# Patient Record
Sex: Male | Born: 1980 | Race: Black or African American | Hispanic: No | Marital: Single
Health system: Southern US, Community
[De-identification: ages and names within clinical notes are randomized; demographics above are authoritative.]

---

## 2005-07-06 ENCOUNTER — Emergency Department (HOSPITAL_COMMUNITY): Admission: EM | Admit: 2005-07-06 | Discharge: 2005-07-06 | Payer: Self-pay | Admitting: Family Medicine

## 2005-09-21 ENCOUNTER — Encounter: Admission: RE | Admit: 2005-09-21 | Discharge: 2005-09-21 | Payer: Self-pay | Admitting: General Practice

## 2008-09-12 ENCOUNTER — Encounter: Admission: RE | Admit: 2008-09-12 | Discharge: 2008-09-12 | Payer: Self-pay | Admitting: General Practice

## 2010-06-14 IMAGING — CR DG CHEST 2V
2 series · 2 of 2 positions shown · non-contrast
Comparison: 09/21/2005

CLINICAL DATA: History of PPD with BCG

CHEST - 2 VIEW

[view not recorded (1 of 2)]
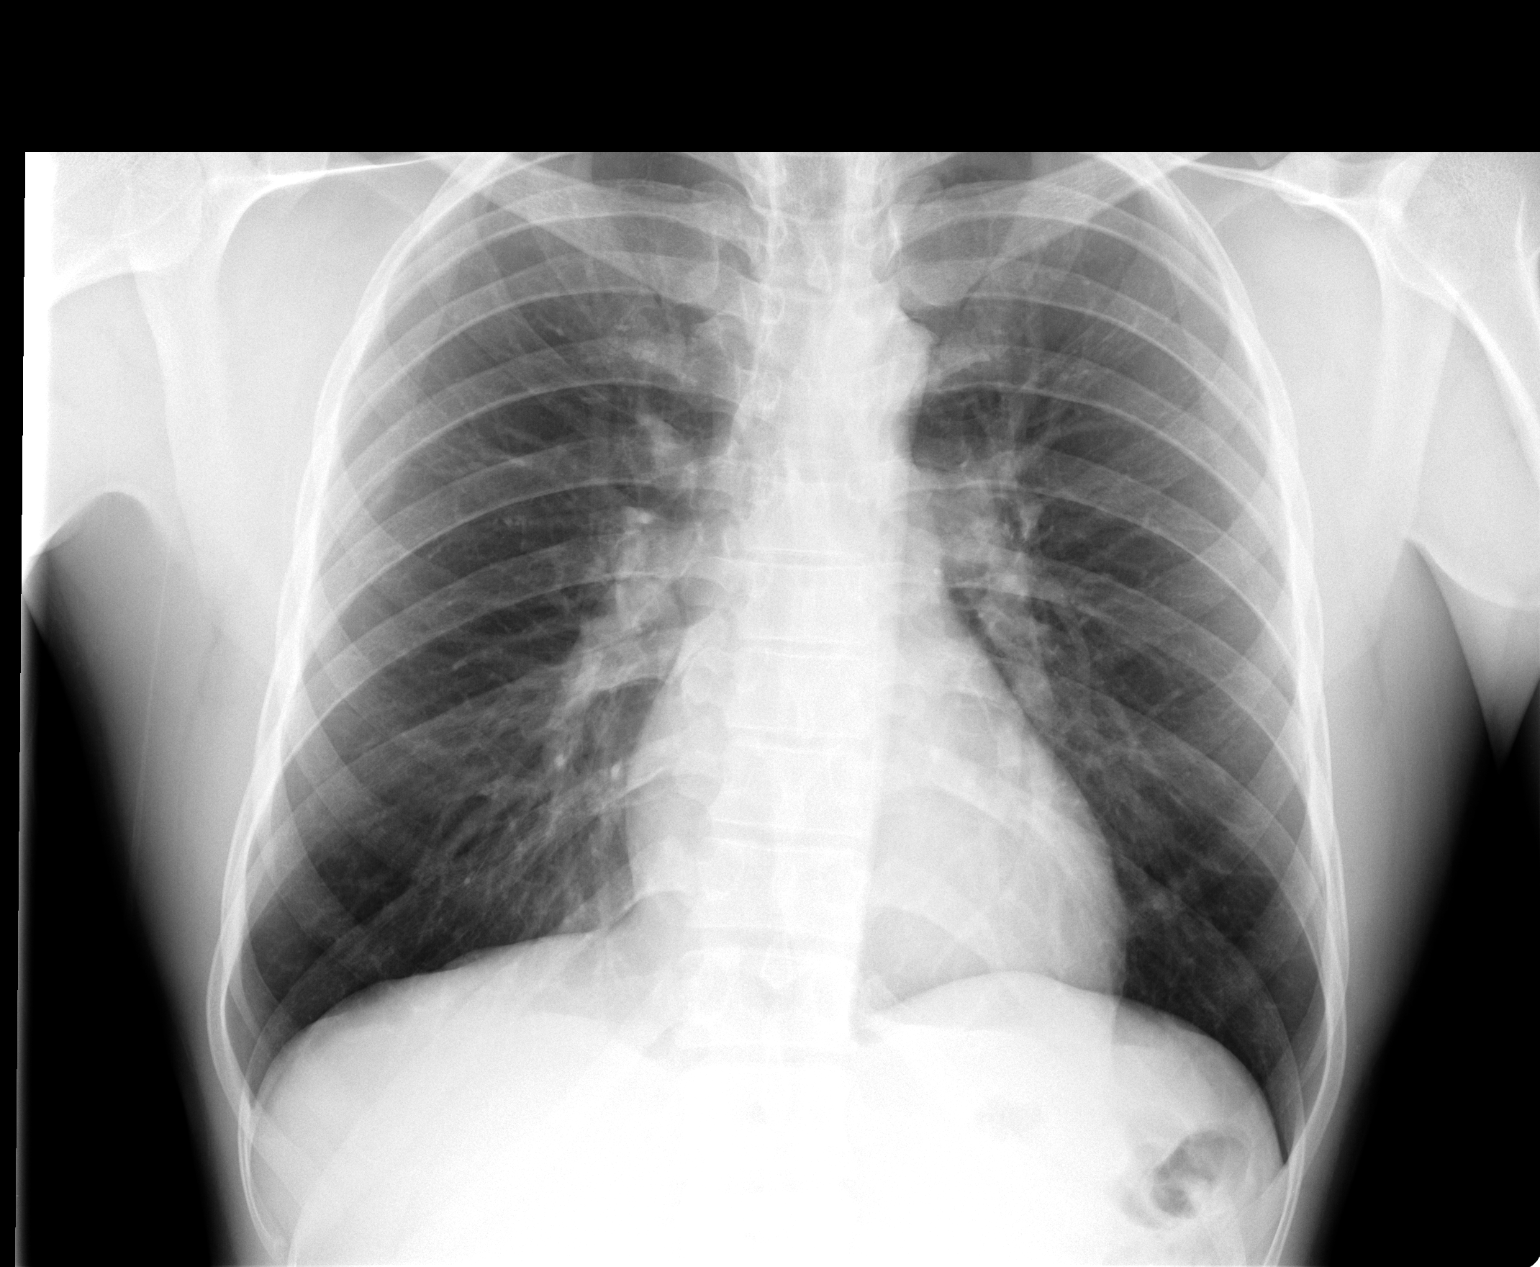

[view not recorded (2 of 2)]
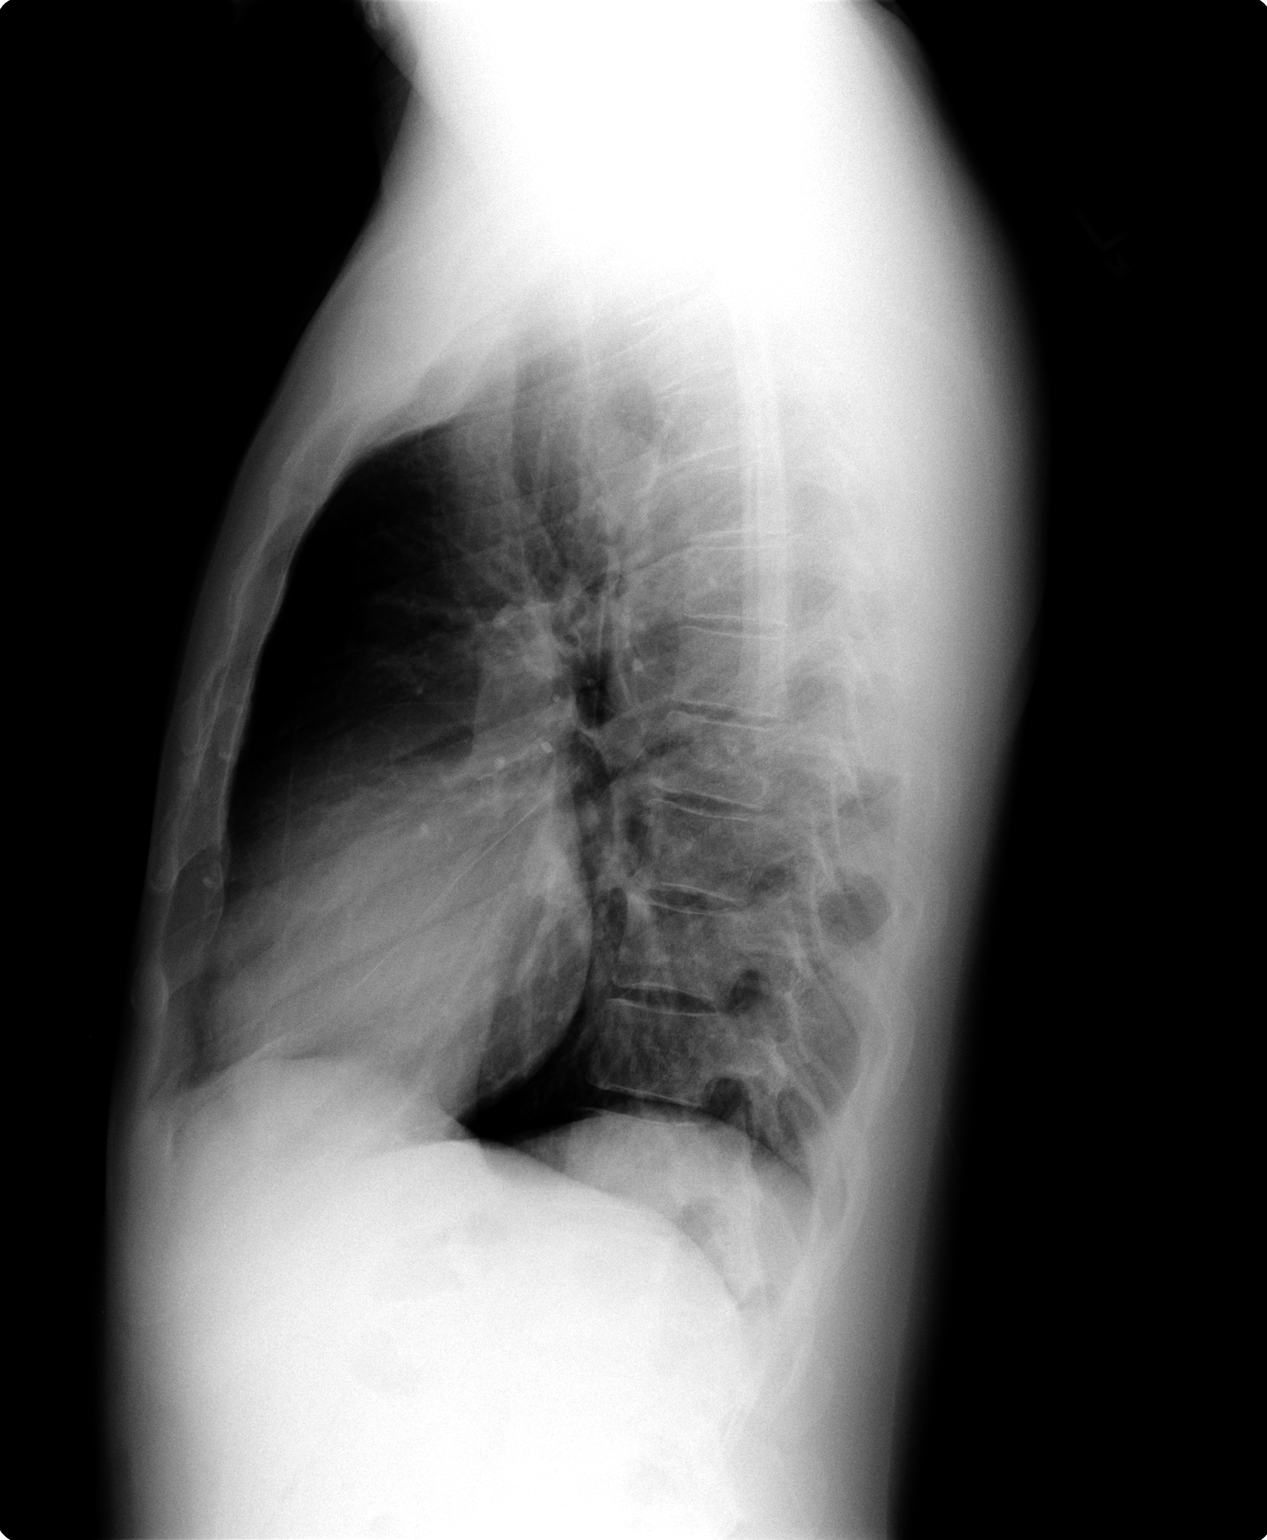

[2 of 2 positions shown; findings below may reference images not displayed]

FINDINGS: The heart size and mediastinal contours are within normal
limits.  Both lungs are clear.  The visualized skeletal structures
are unremarkable.
IMPRESSION: No active cardiopulmonary disease.

## 2010-06-22 ENCOUNTER — Emergency Department (HOSPITAL_COMMUNITY): Payer: No Typology Code available for payment source

## 2010-06-22 ENCOUNTER — Emergency Department (HOSPITAL_COMMUNITY)
Admission: EM | Admit: 2010-06-22 | Discharge: 2010-06-22 | Disposition: A | Discharge status: Home / Self Care | Payer: No Typology Code available for payment source | Attending: Emergency Medicine | Admitting: Emergency Medicine

## 2010-06-22 DIAGNOSIS — R071 Chest pain on breathing: Secondary | ICD-10-CM | POA: Insufficient documentation
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
## 2013-03-12 ENCOUNTER — Telehealth (HOSPITAL_COMMUNITY): Payer: Self-pay | Admitting: Emergency Medicine

## 2013-03-12 ENCOUNTER — Emergency Department (INDEPENDENT_AMBULATORY_CARE_PROVIDER_SITE_OTHER)
Admission: EM | Admit: 2013-03-12 | Discharge: 2013-03-12 | Disposition: A | Discharge status: Home / Self Care | Payer: Self-pay | Source: Home / Self Care | Attending: Family Medicine | Admitting: Family Medicine

## 2013-03-12 ENCOUNTER — Encounter (HOSPITAL_COMMUNITY): Payer: Self-pay | Admitting: Emergency Medicine

## 2013-03-12 DIAGNOSIS — K645 Perianal venous thrombosis: Secondary | ICD-10-CM

## 2013-03-12 MED ORDER — HYDROCORTISONE ACE-PRAMOXINE 1-1 % RE FOAM: 1.0000 | Freq: Four times a day (QID) | RECTAL | Status: AC

## 2013-03-12 MED ORDER — HYDROCORTISONE ACE-PRAMOXINE 1-1 % RE FOAM: 1.0000 | Freq: Four times a day (QID) | RECTAL | Status: DC

## 2013-03-12 MED ORDER — DOCUSATE SODIUM 100 MG PO CAPS: 100.0000 mg | ORAL_CAPSULE | Freq: Two times a day (BID) | ORAL | Status: AC

## 2013-03-12 NOTE — ED Notes (Signed)
Family member called and said the Proctofoam was $120.00 and he could not afford that.  She asked if it can be changed to Proctosol HC cream.  Discussed with Almedia Balls PA.  He said the foam would help better but if he can't pay out of pocket then it was OK to change it.  23  I called [facility] pharmacist @ [phone] and gave the changed order. Vassie Moselle 03/12/2013

## 2013-03-12 NOTE — ED Notes (Signed)
C/O HEMORRHOIDS WHICH COME AND GO FOR YEARS BUT RECENTLY HAS HAD ACUTE EPISODE IN THE LAST 24 HOURS WITCH HAZEL, WARM COMPRESS WAS USED AS TREATMENT. CREAMS HAS BEEN USED IN THE PAST.

## 2013-03-12 NOTE — ED Provider Notes (Signed)
CSN: [id number]     Arrival date & time 03/12/13  1552 History   First MD Initiated Contact with Patient 03/12/13 1657     Chief Complaint  Patient presents with  . Hemorrhoids   (Consider location/radiation/quality/duration/timing/severity/associated sxs/prior Treatment) HPI Comments: 32 year old male presents complaining of possible hemorrhoids. He has had these in the past. He has occasional bleeding and itching her once in the past they became very swollen and painful. He did get better after a few days. This time, he has again developed painful swelling around the rectum. The area is very painful to touch or when he has a bowel movement. The pain has been gradually increasing. He has used witch hazel and warm compresses which helps temporarily.   History reviewed. No pertinent past medical history. No past surgical history on file. No family history on file. History  Substance Use Topics  . Smoking status: Not on file  . Smokeless tobacco: Not on file  . Alcohol Use: Not on file    Review of Systems  Constitutional: Negative for fever, chills and fatigue.  HENT: Negative for sore throat.   Eyes: Negative for visual disturbance.  Respiratory: Negative for cough and shortness of breath.   Cardiovascular: Negative for chest pain, palpitations and leg swelling.  Gastrointestinal: Positive for rectal pain. Negative for nausea, vomiting, abdominal pain, diarrhea and constipation.  Genitourinary: Negative for dysuria, urgency, frequency and hematuria.  Musculoskeletal: Negative for arthralgias, myalgias, neck pain and neck stiffness.  Skin: Negative for rash.  Neurological: Negative for dizziness, weakness and light-headedness.    Allergies  Review of patient's allergies indicates no known allergies.  Home Medications   Current Outpatient Rx  Name  Route  Sig  Dispense  Refill  . docusate sodium (COLACE) 100 MG capsule   Oral   Take 1 capsule (100 mg total) by mouth every 12  (twelve) hours.   60 capsule   0   . hydrocortisone-pramoxine (PROCTOFOAM HC) rectal foam   Rectal   Place 1 applicator rectally 4 (four) times daily.   10 g   0    BP 134/86  Pulse 73  Temp(Src) 98.1 F (36.7 C) (Oral)  Resp 18  SpO2 100% Physical Exam  Nursing note and vitals reviewed. Constitutional: He is oriented to person, place, and time. He appears well-developed and well-nourished. No distress.  HENT:  Head: Normocephalic.  Pulmonary/Chest: Effort normal. No respiratory distress.  Genitourinary: Rectal exam shows external hemorrhoid (2 hemorrhoids, thrombosed, tender).  Neurological: He is alert and oriented to person, place, and time. Coordination normal.  Skin: Skin is warm and dry. No rash noted. He is not diaphoretic.  Psychiatric: He has a normal mood and affect. Judgment normal.    ED Course  Procedures (including critical care time) Labs Review Labs Reviewed - No data to display Imaging Review No results found.    MDM   1. Hemorrhoids, external, thrombosed    Hemorrhoid with 2% lidocaine with epinephrine, incised and drained. Entire clot could not be expressed. Continue treatment with ProctoFoam HC, sitz baths, stool softener. Will refer to Gen. surgery for hemorrhoidectomy   Meds ordered this encounter  Medications  . DISCONTD: hydrocortisone-pramoxine (PROCTOFOAM HC) rectal foam    Sig: Place 1 applicator rectally 4 (four) times daily.    Dispense:  10 g    Refill:  0    Order Specific Question:  Supervising Provider    Answer:  Linna Hoff [phone]  . hydrocortisone-pramoxine (PROCTOFOAM HC) rectal  foam    Sig: Place 1 applicator rectally 4 (four) times daily.    Dispense:  10 g    Refill:  0    Order Specific Question:  Supervising Provider    Answer:  Linna Hoff [phone]  . docusate sodium (COLACE) 100 MG capsule    Sig: Take 1 capsule (100 mg total) by mouth every 12 (twelve) hours.    Dispense:  60 capsule    Refill:  0    Order  Specific Question:  Supervising Provider    Answer:  Bradd Canary D [[id number]]      Graylon Good, PA-C 03/12/13 1844

## 2013-03-12 NOTE — ED Provider Notes (Signed)
Medical screening examination/treatment/procedure(s) were performed by resident physician or non-physician practitioner and as supervising physician I was immediately available for consultation/collaboration.   Barkley Bruns MD.   Linna Hoff, MD 03/12/13 2001

## 2016-09-01 ENCOUNTER — Ambulatory Visit: Payer: Self-pay | Admitting: Family Medicine

## 2020-01-23 ENCOUNTER — Other Ambulatory Visit: Payer: Self-pay

## 2020-01-23 DIAGNOSIS — Z20822 Contact with and (suspected) exposure to covid-19: Secondary | ICD-10-CM

## 2020-01-25 LAB — SARS-COV-2, NAA 2 DAY TAT

## 2020-01-25 LAB — NOVEL CORONAVIRUS, NAA: SARS-CoV-2, NAA: NOT DETECTED

## 2020-05-13 ENCOUNTER — Other Ambulatory Visit: Payer: Self-pay

## 2020-05-13 DIAGNOSIS — Z20822 Contact with and (suspected) exposure to covid-19: Secondary | ICD-10-CM

## 2020-05-16 LAB — SARS-COV-2, NAA 2 DAY TAT

## 2020-05-16 LAB — NOVEL CORONAVIRUS, NAA: SARS-CoV-2, NAA: DETECTED — AB

## 2020-08-01 ENCOUNTER — Ambulatory Visit (INDEPENDENT_AMBULATORY_CARE_PROVIDER_SITE_OTHER): Payer: BC Managed Care – PPO | Admitting: Sports Medicine

## 2020-08-01 ENCOUNTER — Other Ambulatory Visit: Payer: Self-pay

## 2020-08-01 ENCOUNTER — Encounter: Payer: Self-pay | Admitting: Sports Medicine

## 2020-08-01 DIAGNOSIS — L74513 Primary focal hyperhidrosis, soles: Secondary | ICD-10-CM | POA: Diagnosis not present

## 2020-08-01 DIAGNOSIS — M79672 Pain in left foot: Secondary | ICD-10-CM | POA: Diagnosis not present

## 2020-08-01 DIAGNOSIS — M79671 Pain in right foot: Secondary | ICD-10-CM

## 2020-08-01 DIAGNOSIS — Q828 Other specified congenital malformations of skin: Secondary | ICD-10-CM

## 2020-08-01 MED ORDER — MICONAZOLE NITRATE 2 % EX POWD: Freq: Every day | CUTANEOUS | 0 refills | Status: AC

## 2020-08-01 MED ORDER — DRYSOL 20 % EX SOLN: Freq: Every day | CUTANEOUS | 5 refills | Status: AC

## 2020-08-01 NOTE — Progress Notes (Signed)
Subjective: Matthew Haas is a 40 y.o. male patient who presents to office for evaluation of Left foot pain secondary to callus skin and reports that his feet excessively sweat. Patient complains of pain at the lesion present on the submet 2 on the left foot.  Patient has tried changing of socks and shoes with no relief in symptoms.  Reports that he spends excessive hours in steel toe boots and had issues with sweaty feet since high school.  Patient denies any other pedal complaints.   Review of system noncontributory  There are no problems to display for this patient.   Current Outpatient Medications on File Prior to Visit  Medication Sig Dispense Refill  . albuterol (VENTOLIN HFA) 108 (90 Base) MCG/ACT inhaler SMARTSIG:2 Puff(s) By Mouth Every 4 Hours PRN    . azithromycin (ZITHROMAX) 250 MG tablet Take 250 mg by mouth 2 (two) times daily.    Marland Kitchen Dexamethasone 1.5 MG (21) TBPK Take by mouth.    . docusate sodium (COLACE) 100 MG capsule Take 1 capsule (100 mg total) by mouth every 12 (twelve) hours. 60 capsule 0  . hydrocortisone-pramoxine (PROCTOFOAM HC) rectal foam Place 1 applicator rectally 4 (four) times daily. 10 g 0  . Naftifine HCl 2 % CREA Apply 1 a small amount to affected area twice a day    . oseltamivir (TAMIFLU) 75 MG capsule Take 75 mg by mouth 2 (two) times daily.     No current facility-administered medications on file prior to visit.    No Known Allergies  Objective:  General: Alert and oriented x3 in no acute distress  Dermatology: Keratotic lesion present bilateral hallux and sub met 2 on left with skin lines transversing the lesion, pain is present with direct pressure to the lesion with a central nucleated core noted, no webspace macerations, no ecchymosis bilateral, all nails x 10 are well manicured.  Vascular: Dorsalis Pedis and Posterior Tibial pedal pulses 2/4, Capillary Fill Time 3 seconds, + pedal hair growth bilateral, no edema bilateral lower extremities,  Temperature gradient within normal limits with increased moisture consistent with hyperhidrosis.  Neurology: Johney Maine sensation intact via light touch bilateral.  Musculoskeletal: Mild tenderness with palpation at the keratotic lesion site on left forefoot plantarly.  Pes planus foot type.  Muscular strength 5/5 in all groups without pain or limitation on range of motion. No lower extremity muscular or boney deformity noted.  Assessment and Plan: Problem List Items Addressed This Visit   None   Visit Diagnoses    Sweaty feet    -  Primary   Porokeratosis       Foot pain, bilateral          -Complete examination performed -Discussed treatment options -Parred keratoic lesions x3 using a chisel blade without incident -Prescribe Drysol and miconazole powder to use as directed -Encouraged to purchase synthetic blend socks inside 100% cotton -Advised good supportive shoes and inserts -Patient to return to office 4 to 6 weeks or sooner if condition worsens.  Landis Martins, DPM

## 2020-09-19 ENCOUNTER — Ambulatory Visit: Payer: BC Managed Care – PPO | Admitting: Sports Medicine

## 2022-04-14 ENCOUNTER — Other Ambulatory Visit: Payer: Self-pay

## 2022-04-15 ENCOUNTER — Ambulatory Visit: Payer: Self-pay | Attending: Obstetrics and Gynecology

## 2022-04-15 DIAGNOSIS — O283 Abnormal ultrasonic finding on antenatal screening of mother: Secondary | ICD-10-CM

## 2022-04-22 LAB — FOLLOW-UP PRENATAL CMA QPCR: # of Genotyping Targets: 1
